# Patient Record
Sex: Male | Born: 1980 | Race: Black or African American | Hispanic: Yes | Marital: Single
Health system: Southern US, Community
[De-identification: ages and names within clinical notes are randomized; demographics above are authoritative.]

## PROBLEM LIST (undated history)

## (undated) DIAGNOSIS — I10 Essential (primary) hypertension: Secondary | ICD-10-CM

## (undated) DIAGNOSIS — K7460 Unspecified cirrhosis of liver: Secondary | ICD-10-CM

## (undated) DIAGNOSIS — G4730 Sleep apnea, unspecified: Secondary | ICD-10-CM

## (undated) HISTORY — DX: Unspecified cirrhosis of liver: K74.60

## (undated) HISTORY — DX: Essential (primary) hypertension: I10

---

## 2012-05-14 ENCOUNTER — Emergency Department (HOSPITAL_COMMUNITY)
Admission: EM | Admit: 2012-05-14 | Discharge: 2012-05-14 | Disposition: A | Discharge status: Other Acute Inpatient Hospital | Payer: Self-pay

## 2013-06-24 ENCOUNTER — Encounter (HOSPITAL_COMMUNITY): Payer: Self-pay | Admitting: Emergency Medicine

## 2013-06-24 ENCOUNTER — Emergency Department (HOSPITAL_COMMUNITY)
Admission: EM | Admit: 2013-06-24 | Discharge: 2013-06-25 | Disposition: A | Discharge status: Home / Self Care | Payer: No Typology Code available for payment source | Attending: Emergency Medicine | Admitting: Emergency Medicine

## 2013-06-24 ENCOUNTER — Emergency Department (HOSPITAL_COMMUNITY): Payer: No Typology Code available for payment source

## 2013-06-24 DIAGNOSIS — R079 Chest pain, unspecified: Secondary | ICD-10-CM | POA: Insufficient documentation

## 2013-06-24 DIAGNOSIS — F17200 Nicotine dependence, unspecified, uncomplicated: Secondary | ICD-10-CM | POA: Insufficient documentation

## 2013-06-24 DIAGNOSIS — M7989 Other specified soft tissue disorders: Secondary | ICD-10-CM | POA: Insufficient documentation

## 2013-06-24 DIAGNOSIS — R0602 Shortness of breath: Secondary | ICD-10-CM | POA: Insufficient documentation

## 2013-06-24 HISTORY — DX: Sleep apnea, unspecified: G47.30

## 2013-06-24 NOTE — ED Notes (Signed)
Pt states he has severe sleep apnea, when he attempts to lie down to sleep he becomes [facility] with central chest discomfort when he takes a deep breath. Pt states his follow up appointment is tomorrow.

## 2013-06-24 NOTE — ED Notes (Signed)
Patient states he only has pain in the chest when he intentionally takes a really deep breath, otherwise there is no pain. He states he is having panic attacks due to his sleep apnea. He has not slept in 3 days due to a fear of not being able to breath. He has an appointment tomorrow for cpap with his primary care physician.

## 2013-06-24 NOTE — ED Provider Notes (Signed)
CSN: [id number]     Arrival date & time 06/24/13  2135 History   First MD Initiated Contact with Patient 06/24/13 2308     Chief Complaint  Patient presents with  . Shortness of Breath  . Chest Pain     (Consider location/radiation/quality/duration/timing/severity/associated sxs/prior Treatment) HPI Comments: Patient is a 33 yo M PMHx significant for tobacco abuse presenting to the ED for increased episodes of shortness of breath and central chest pain over the last several months. He states that he only gets the episodes of SOB and chest pain while he is asleep. Denies any day time symptoms. Patient states that he has had decreased sleep over the last three nights d/t SOB, CP. He states he starts to follow asleep and feels himself stop breathing. He states he has become very anxious about going to sleep. He states he is finally following up with a physician tomorrow to get testing for sleep apnea. Patient endorses intermittent episodes of bilateral leg swelling. Denies any fevers, chills, nausea, vomiting, diarrhea, abdominal pain. PERC negative.    Past Medical History  Diagnosis Date  . Sleep apnea    History reviewed. No pertinent past surgical history. No family history on file. History  Substance Use Topics  . Smoking status: Current Some Day Smoker  . Smokeless tobacco: Not on file  . Alcohol Use: Yes     Comment: social    Review of Systems  Constitutional: Negative for fever and chills.  Respiratory: Positive for shortness of breath. Negative for cough and chest tightness.   Cardiovascular: Positive for chest pain and leg swelling (bilateral).  Gastrointestinal: Negative for nausea, vomiting, abdominal pain, diarrhea and constipation.  All other systems reviewed and are negative.     Allergies  Review of patient's allergies indicates no known allergies.  Home Medications   Prior to Admission medications   Not on File   BP 151/72  Pulse 93  Temp(Src) 98.6 F (37  C) (Oral)  Resp 20  Ht 6' (1.829 m)  Wt 350 lb (158.759 kg)  BMI 47.46 kg/m2  SpO2 99% Physical Exam  Nursing note and vitals reviewed. Constitutional: He is oriented to person, place, and time. He appears well-developed and well-nourished. No distress.  HENT:  Head: Normocephalic and atraumatic.  Right Ear: External ear normal.  Left Ear: External ear normal.  Nose: Nose normal.  Mouth/Throat: Oropharynx is clear and moist. No oropharyngeal exudate.  Eyes: Conjunctivae are normal.  Neck: Neck supple.  Cardiovascular: Normal rate, regular rhythm, normal heart sounds and intact distal pulses.   Cap refill < 3 sec  Pulmonary/Chest: Effort normal and breath sounds normal. No respiratory distress. He has no wheezes. He has no rales. He exhibits no tenderness.  Abdominal: Soft. There is no tenderness.  Musculoskeletal: Normal range of motion. He exhibits no edema.  Neurological: He is alert and oriented to person, place, and time. Gait normal. GCS eye subscore is 4. GCS verbal subscore is 5. GCS motor subscore is 6.  Moves all extremities w/o ataxia.   Skin: Skin is warm and dry. He is not diaphoretic. No cyanosis. Nails show no clubbing.    ED Course  Procedures (including critical care time) Medications - No data to display  Labs Review Labs Reviewed  BASIC METABOLIC PANEL - Abnormal; Notable for the following:    Sodium 136 (*)    All other components within normal limits  TROPONIN I  PRO B NATRIURETIC PEPTIDE  CBC    Imaging  Review Dg Chest 2 View  06/24/2013   CLINICAL DATA:  Short of breath for 3 days.  EXAM: CHEST  2 VIEW  COMPARISON:  09/13/2005  FINDINGS: The heart size and mediastinal contours are within normal limits. Both lungs are clear. The visualized skeletal structures are unremarkable.  IMPRESSION: No active cardiopulmonary disease.   Electronically Signed   By: Amie Portlandavid  Ormond M.D.   On: 06/24/2013 23:38     EKG Interpretation   Date/Time:  Wednesday June 24 2013 21:59:28 EDT Ventricular Rate:  101 PR Interval:  140 QRS Duration: 89 QT Interval:  344 QTC Calculation: 446 R Axis:   56 Text Interpretation:  Sinus tachycardia Nonspecific T abnormalities,  diffuse leads No old tracing to compare Confirmed by CAMPOS  MD, KEVIN  ([id number]) on 06/25/2013 12:42:07 AM      MDM   Final diagnoses:  Shortness of breath    Filed Vitals:   06/25/13 0107  BP: 151/72  Pulse: 93  Temp: 98.6 F (37 C)  Resp: 20   Heart Score 1   Afebrile, NAD, non-toxic appearing, AAOx4.   Patient is to be discharged with recommendation to follow up with PCP at tomorrow's scheduled appointment in regards to today's hospital visit. Chest pain is not likely of cardiac or pulmonary etiology d/t presentation, perc negative, VSS, no tracheal deviation, no JVD or new murmur, RRR, breath sounds equal bilaterally, EKG without acute abnormalities, negative troponin, and negative CXR. Symptoms consistent with sleep apnea. Discussed with patient that he'll likely need CPAP or BiPAP along with weight loss for relief from symptoms and/or correction of these symptoms. Pt has been advised to return to the ED is CP becomes exertional, associated with diaphoresis or nausea, radiates to left jaw/arm, worsens or becomes concerning in any way. Pt appears reliable for follow up and is agreeable to discharge. Case has been discussed with Dr. Patria Maneampos who agrees with the above plan to discharge.      Jeannetta EllisJennifer L Jaquelyn Sakamoto, PA-C 06/25/13 [phone]

## 2013-06-25 LAB — BASIC METABOLIC PANEL
BUN: 10 mg/dL (ref 6–23)
CALCIUM: 9.3 mg/dL (ref 8.4–10.5)
CO2: 23 mEq/L (ref 19–32)
Chloride: 99 mEq/L (ref 96–112)
Creatinine, Ser: 1.01 mg/dL (ref 0.50–1.35)
GLUCOSE: 96 mg/dL (ref 70–99)
Potassium: 3.8 mEq/L (ref 3.7–5.3)
Sodium: 136 mEq/L — ABNORMAL LOW (ref 137–147)

## 2013-06-25 LAB — CBC
HCT: 43.2 % (ref 39.0–52.0)
Hemoglobin: 14.9 g/dL (ref 13.0–17.0)
MCH: 29 pg (ref 26.0–34.0)
MCHC: 34.5 g/dL (ref 30.0–36.0)
MCV: 84 fL (ref 78.0–100.0)
PLATELETS: 269 10*3/uL (ref 150–400)
RBC: 5.14 MIL/uL (ref 4.22–5.81)
RDW: 15.2 % (ref 11.5–15.5)
WBC: 6.4 10*3/uL (ref 4.0–10.5)

## 2013-06-25 LAB — PRO B NATRIURETIC PEPTIDE: PRO B NATRI PEPTIDE: 6.2 pg/mL (ref 0–125)

## 2013-06-25 LAB — TROPONIN I

## 2013-06-25 NOTE — Discharge Instructions (Signed)
Please follow up with your primary care physician in 1-2 days. If you do not have one please call the [facility] and wellness Center number listed above. Please keep your follow up appointment for your sleep apnea tomorrow. Please read all discharge instructions and return precautions.   Sleep Apnea  Sleep apnea is a sleep disorder characterized by abnormal pauses in breathing while you sleep. When your breathing pauses, the level of oxygen in your blood decreases. This causes you to move out of deep sleep and into light sleep. As a result, your quality of sleep is poor, and the system that carries your blood throughout your body (cardiovascular system) experiences stress. If sleep apnea remains untreated, the following conditions can develop:  High blood pressure (hypertension).  Coronary artery disease.  Inability to achieve or maintain an erection (impotence).  Impairment of your thought process (cognitive dysfunction). There are three types of sleep apnea: 1. Obstructive sleep apnea Pauses in breathing during sleep because of a blocked airway. 2. Central sleep apnea Pauses in breathing during sleep because the area of the brain that controls your breathing does not send the correct signals to the muscles that control breathing. 3. Mixed sleep apnea A combination of both obstructive and central sleep apnea. RISK FACTORS The following risk factors can increase your risk of developing sleep apnea:  Being overweight.  Smoking.  Having narrow passages in your nose and throat.  Being of older age.  Being male.  Alcohol use.  Sedative and tranquilizer use.  Ethnicity. Among individuals younger than 35 years, African Americans are at increased risk of sleep apnea. SYMPTOMS   Difficulty staying asleep.  Daytime sleepiness and fatigue.  Loss of energy.  Irritability.  Loud, heavy snoring.  Morning headaches.  Trouble concentrating.  Forgetfulness.  Decreased interest in  sex. DIAGNOSIS  In order to diagnose sleep apnea, your caregiver will perform a physical examination. Your caregiver may suggest that you take a home sleep test. Your caregiver may also recommend that you spend the night in a sleep lab. In the sleep lab, several monitors record information about your heart, lungs, and brain while you sleep. Your leg and arm movements and blood oxygen level are also recorded. TREATMENT The following actions may help to resolve mild sleep apnea:  Sleeping on your side.   Using a decongestant if you have nasal congestion.   Avoiding the use of depressants, including alcohol, sedatives, and narcotics.   Losing weight and modifying your diet if you are overweight. There also are devices and treatments to help open your airway:  Oral appliances. These are custom-made mouthpieces that shift your lower jaw forward and slightly open your bite. This opens your airway.  Devices that create positive airway pressure. This positive pressure "splints" your airway open to help you breathe better during sleep. The following devices create positive airway pressure:  Continuous positive airway pressure (CPAP) device. The CPAP device creates a continuous level of air pressure with an air pump. The air is delivered to your airway through a mask while you sleep. This continuous pressure keeps your airway open.  Nasal expiratory positive airway pressure (EPAP) device. The EPAP device creates positive air pressure as you exhale. The device consists of single-use valves, which are inserted into each nostril and held in place by adhesive. The valves create very little resistance when you inhale but create much more resistance when you exhale. That increased resistance creates the positive airway pressure. This positive pressure while you exhale  keeps your airway open, making it easier to breath when you inhale again.  Bilevel positive airway pressure (BPAP) device. The BPAP device  is used mainly in patients with central sleep apnea. This device is similar to the CPAP device because it also uses an air pump to deliver continuous air pressure through a mask. However, with the BPAP machine, the pressure is set at two different levels. The pressure when you exhale is lower than the pressure when you inhale.  Surgery. Typically, surgery is only done if you cannot comply with less invasive treatments or if the less invasive treatments do not improve your condition. Surgery involves removing excess tissue in your airway to create a wider passage way. Document Released: 12/29/2001 Document Revised: 05/05/2012 Document Reviewed: 05/17/2011 [facility] Patient Information 2014 [place], [place].

## 2013-06-25 NOTE — ED Provider Notes (Signed)
Medical screening examination/treatment/procedure(s) were performed by non-physician practitioner and as supervising physician I was immediately available for consultation/collaboration.   EKG Interpretation   Date/Time:  Wednesday June 24 2013 21:59:28 EDT Ventricular Rate:  101 PR Interval:  140 QRS Duration: 89 QT Interval:  344 QTC Calculation: 446 R Axis:   56 Text Interpretation:  Sinus tachycardia Nonspecific T abnormalities,  diffuse leads No old tracing to compare Confirmed by Chapman Matteucci  MD, Yitzchak Kothari  ([id number]) on 06/25/2013 12:42:07 AM        Lyanne Co, MD 06/25/13 [phone]

## 2013-10-05 ENCOUNTER — Encounter (HOSPITAL_COMMUNITY): Payer: Self-pay | Admitting: Emergency Medicine

## 2013-10-05 ENCOUNTER — Emergency Department (HOSPITAL_COMMUNITY)
Admission: EM | Admit: 2013-10-05 | Discharge: 2013-10-05 | Disposition: A | Discharge status: Home / Self Care | Payer: No Typology Code available for payment source | Attending: Emergency Medicine | Admitting: Emergency Medicine

## 2013-10-05 DIAGNOSIS — G4700 Insomnia, unspecified: Secondary | ICD-10-CM | POA: Diagnosis not present

## 2013-10-05 DIAGNOSIS — F17200 Nicotine dependence, unspecified, uncomplicated: Secondary | ICD-10-CM | POA: Insufficient documentation

## 2013-10-05 DIAGNOSIS — G4730 Sleep apnea, unspecified: Secondary | ICD-10-CM | POA: Insufficient documentation

## 2013-10-05 NOTE — ED Notes (Signed)
Pt c/o insomnia x 5 days and "possibly a seizure" this morning.  Denies pain.  Pt reports "I haven't been able to sleep, because I stop breathing and it triggers a panic attack."  Sts he is supposed to schedule an appointment at sleep clinic, but he "doesn't have the grand to spend."  Pt thinks he had a seizure because he "couldn't move and was shaking."

## 2013-10-05 NOTE — ED Provider Notes (Signed)
CSN: [id number]     Arrival date & time 10/05/13  4098 History   First MD Initiated Contact with Patient 10/05/13 [phone]     Chief Complaint  Patient presents with  . Insomnia     (Consider location/radiation/quality/duration/timing/severity/associated sxs/prior Treatment) HPI 33 year old male presents with worsening insomnia over the past 5 days and was concerned with a possible seizure. Patient has a history of sleep apnea diagnosed in the [place] several years ago. He is currently supposed to be getting a sleep study but is unable to afford the up front cost of $1500. Does not use CPAP. Has a primary care doctor who ordered the sleep test.  Patient feels very tired but when he goes to sleep he feels that he stops breathing and wakes up. This morning he ended up waking up and being unable to move for a few seconds. He also felt twitching in his extremities and was concerned he had a seizure. He is awake during this time and unable to move shortly after. The only time he is able to follow sleep is when he drinks alcohol like he did a few days ago.  Past Medical History  Diagnosis Date  . Sleep apnea    History reviewed. No pertinent past surgical history. History reviewed. No pertinent family history. History  Substance Use Topics  . Smoking status: Current Some Day Smoker  . Smokeless tobacco: Not on file  . Alcohol Use: Yes     Comment: social    Review of Systems  Cardiovascular: Negative for chest pain.  Gastrointestinal: Negative for vomiting.  Neurological: Negative for weakness, numbness and headaches.  All other systems reviewed and are negative.     Allergies  Review of patient's allergies indicates no known allergies.  Home Medications   Prior to Admission medications   Not on File   BP 140/82  Pulse 67  Temp(Src) 98.2 F (36.8 C) (Oral)  Resp 16  SpO2 98% Physical Exam  Nursing note and vitals reviewed. Constitutional: He is oriented to person,  place, and time. He appears well-developed and well-nourished.  obese  HENT:  Head: Normocephalic and atraumatic.  Right Ear: External ear normal.  Left Ear: External ear normal.  Nose: Nose normal.  Eyes: EOM are normal. Pupils are equal, round, and reactive to light. Right eye exhibits no discharge. Left eye exhibits no discharge.  Neck: Neck supple.  Cardiovascular: Normal rate, regular rhythm, normal heart sounds and intact distal pulses.   Pulmonary/Chest: Effort normal and breath sounds normal.  Speaks in normal sentences  Abdominal: Soft. He exhibits no distension. There is no tenderness.  Musculoskeletal: He exhibits no edema.  Neurological: He is alert and oriented to person, place, and time.  Cn 2-12 grossly intact. 5/5 strength in all 4 extremities. Grossly normal sensation  Skin: Skin is warm and dry.    ED Course  Procedures (including critical care time) Labs Review Labs Reviewed - No data to display  Imaging Review No results found.   EKG Interpretation None      MDM   Final diagnoses:  Insomnia  Sleep apnea in adult    Patient's twitching this morning does not seem consistent with a seizure. This seems related to his sleep apnea and overall poor amount of sleep. I offered giving the patient a sleeping aid such as Ambien but the patient declined saying he is tired enough but cannot stay asleep. I told him it is important to lose weight which he says he  has been already working on. The patient does have a primary care doctor will call them right after discharge today to try and see if he can be started on CPAP or needs to finish his sleep study first. At this time however I do not feel he needs any more ER workup and does not need a seizure workup at this time.    Audree Camel, MD 10/05/13 [phone]

## 2013-10-05 NOTE — Discharge Instructions (Signed)
Sleep Apnea  Sleep apnea is a sleep disorder characterized by abnormal pauses in breathing while you sleep. When your breathing pauses, the level of oxygen in your blood decreases. This causes you to move out of deep sleep and into light sleep. As a result, your quality of sleep is poor, and the system that carries your blood throughout your body (cardiovascular system) experiences stress. If sleep apnea remains untreated, the following conditions can develop:  High blood pressure (hypertension).  Coronary artery disease.  Inability to achieve or maintain an erection (impotence).  Impairment of your thought process (cognitive dysfunction). There are three types of sleep apnea: 1. Obstructive sleep apnea--Pauses in breathing during sleep because of a blocked airway. 2. Central sleep apnea--Pauses in breathing during sleep because the area of the brain that controls your breathing does not send the correct signals to the muscles that control breathing. 3. Mixed sleep apnea--A combination of both obstructive and central sleep apnea. RISK FACTORS The following risk factors can increase your risk of developing sleep apnea:  Being overweight.  Smoking.  Having narrow passages in your nose and throat.  Being of older age.  Being male.  Alcohol use.  Sedative and tranquilizer use.  Ethnicity. Among individuals younger than 35 years, African Americans are at increased risk of sleep apnea. SYMPTOMS   Difficulty staying asleep.  Daytime sleepiness and fatigue.  Loss of energy.  Irritability.  Loud, heavy snoring.  Morning headaches.  Trouble concentrating.  Forgetfulness.  Decreased interest in sex. DIAGNOSIS  In order to diagnose sleep apnea, your caregiver will perform a physical examination. Your caregiver may suggest that you take a home sleep test. Your caregiver may also recommend that you spend the night in a sleep lab. In the sleep lab, several monitors record  information about your heart, lungs, and brain while you sleep. Your leg and arm movements and blood oxygen level are also recorded. TREATMENT The following actions may help to resolve mild sleep apnea:  Sleeping on your side.   Using a decongestant if you have nasal congestion.   Avoiding the use of depressants, including alcohol, sedatives, and narcotics.   Losing weight and modifying your diet if you are overweight. There also are devices and treatments to help open your airway:  Oral appliances. These are custom-made mouthpieces that shift your lower jaw forward and slightly open your bite. This opens your airway.  Devices that create positive airway pressure. This positive pressure "splints" your airway open to help you breathe better during sleep. The following devices create positive airway pressure:  Continuous positive airway pressure (CPAP) device. The CPAP device creates a continuous level of air pressure with an air pump. The air is delivered to your airway through a mask while you sleep. This continuous pressure keeps your airway open.  Nasal expiratory positive airway pressure (EPAP) device. The EPAP device creates positive air pressure as you exhale. The device consists of single-use valves, which are inserted into each nostril and held in place by adhesive. The valves create very little resistance when you inhale but create much more resistance when you exhale. That increased resistance creates the positive airway pressure. This positive pressure while you exhale keeps your airway open, making it easier to breath when you inhale again.  Bilevel positive airway pressure (BPAP) device. The BPAP device is used mainly in patients with central sleep apnea. This device is similar to the CPAP device because it also uses an air pump to deliver continuous air pressure  through a mask. However, with the BPAP machine, the pressure is set at two different levels. The pressure when you  exhale is lower than the pressure when you inhale.  Surgery. Typically, surgery is only done if you cannot comply with less invasive treatments or if the less invasive treatments do not improve your condition. Surgery involves removing excess tissue in your airway to create a wider passage way. Document Released: 12/29/2001 Document Revised: 05/05/2012 Document Reviewed: 05/17/2011 [facility] Patient Information 2015 [place], [place]. This information is not intended to replace advice given to you by your health care provider. Make sure you discuss any questions you have with your health care provider.      Insomnia Insomnia is frequent trouble falling and/or staying asleep. Insomnia can be a long term problem or a short term problem. Both are common. Insomnia can be a short term problem when the wakefulness is related to a certain stress or worry. Long term insomnia is often related to ongoing stress during waking hours and/or poor sleeping habits. Overtime, sleep deprivation itself can make the problem worse. Every little thing feels more severe because you are overtired and your ability to cope is decreased. CAUSES   Stress, anxiety, and depression.  Poor sleeping habits.  Distractions such as TV in the bedroom.  Naps close to bedtime.  Engaging in emotionally charged conversations before bed.  Technical reading before sleep.  Alcohol and other sedatives. They may make the problem worse. They can hurt normal sleep patterns and normal dream activity.  Stimulants such as caffeine for several hours prior to bedtime.  Pain syndromes and shortness of breath can cause insomnia.  Exercise late at night.  Changing time zones may cause sleeping problems (jet lag). It is sometimes helpful to have someone observe your sleeping patterns. They should look for periods of not breathing during the night (sleep apnea). They should also look to see how long those periods last. If you live alone or  observers are uncertain, you can also be observed at a sleep clinic where your sleep patterns will be professionally monitored. Sleep apnea requires a checkup and treatment. Give your caregivers your medical history. Give your caregivers observations your family has made about your sleep.  SYMPTOMS   Not feeling rested in the morning.  Anxiety and restlessness at bedtime.  Difficulty falling and staying asleep. TREATMENT   Your caregiver may prescribe treatment for an underlying medical disorders. Your caregiver can give advice or help if you are using alcohol or other drugs for self-medication. Treatment of underlying problems will usually eliminate insomnia problems.  Medications can be prescribed for short time use. They are generally not recommended for lengthy use.  Over-the-counter sleep medicines are not recommended for lengthy use. They can be habit forming.  You can promote easier sleeping by making lifestyle changes such as:  Using relaxation techniques that help with breathing and reduce muscle tension.  Exercising earlier in the day.  Changing your diet and the time of your last meal. No night time snacks.  Establish a regular time to go to bed.  Counseling can help with stressful problems and worry.  Soothing music and white noise may be helpful if there are background noises you cannot remove.  Stop tedious detailed work at least one hour before bedtime. HOME CARE INSTRUCTIONS   Keep a diary. Inform your caregiver about your progress. This includes any medication side effects. See your caregiver regularly. Take note of:  Times when you are asleep.  Times when you are awake during the night.  The quality of your sleep.  How you feel the next day. This information will help your caregiver care for you.  Get out of bed if you are still awake after 15 minutes. Read or do some quiet activity. Keep the lights down. Wait until you feel sleepy and go back to  bed.  Keep regular sleeping and waking hours. Avoid naps.  Exercise regularly.  Avoid distractions at bedtime. Distractions include watching television or engaging in any intense or detailed activity like attempting to balance the household checkbook.  Develop a bedtime ritual. Keep a familiar routine of bathing, brushing your teeth, climbing into bed at the same time each night, listening to soothing music. Routines increase the success of falling to sleep faster.  Use relaxation techniques. This can be using breathing and muscle tension release routines. It can also include visualizing peaceful scenes. You can also help control troubling or intruding thoughts by keeping your mind occupied with boring or repetitive thoughts like the old concept of counting sheep. You can make it more creative like imagining planting one beautiful flower after another in your backyard garden.  During your day, work to eliminate stress. When this is not possible use some of the previous suggestions to help reduce the anxiety that accompanies stressful situations. MAKE SURE YOU:   Understand these instructions.  Will watch your condition.  Will get help right away if you are not doing well or get worse. Document Released: 01/06/2000 Document Revised: 04/02/2011 Document Reviewed: 02/05/2007 [facility] Patient Information 2015 [place], [place]. This information is not intended to replace advice given to you by your health care provider. Make sure you discuss any questions you have with your health care provider.

## 2014-11-13 IMAGING — CR DG CHEST 2V
2 series · 2 of 2 positions shown · non-contrast
Comparison: 09/13/2005

CLINICAL DATA: Short of breath for 3 days.

EXAM:
CHEST  2 VIEW

[w chest pa]
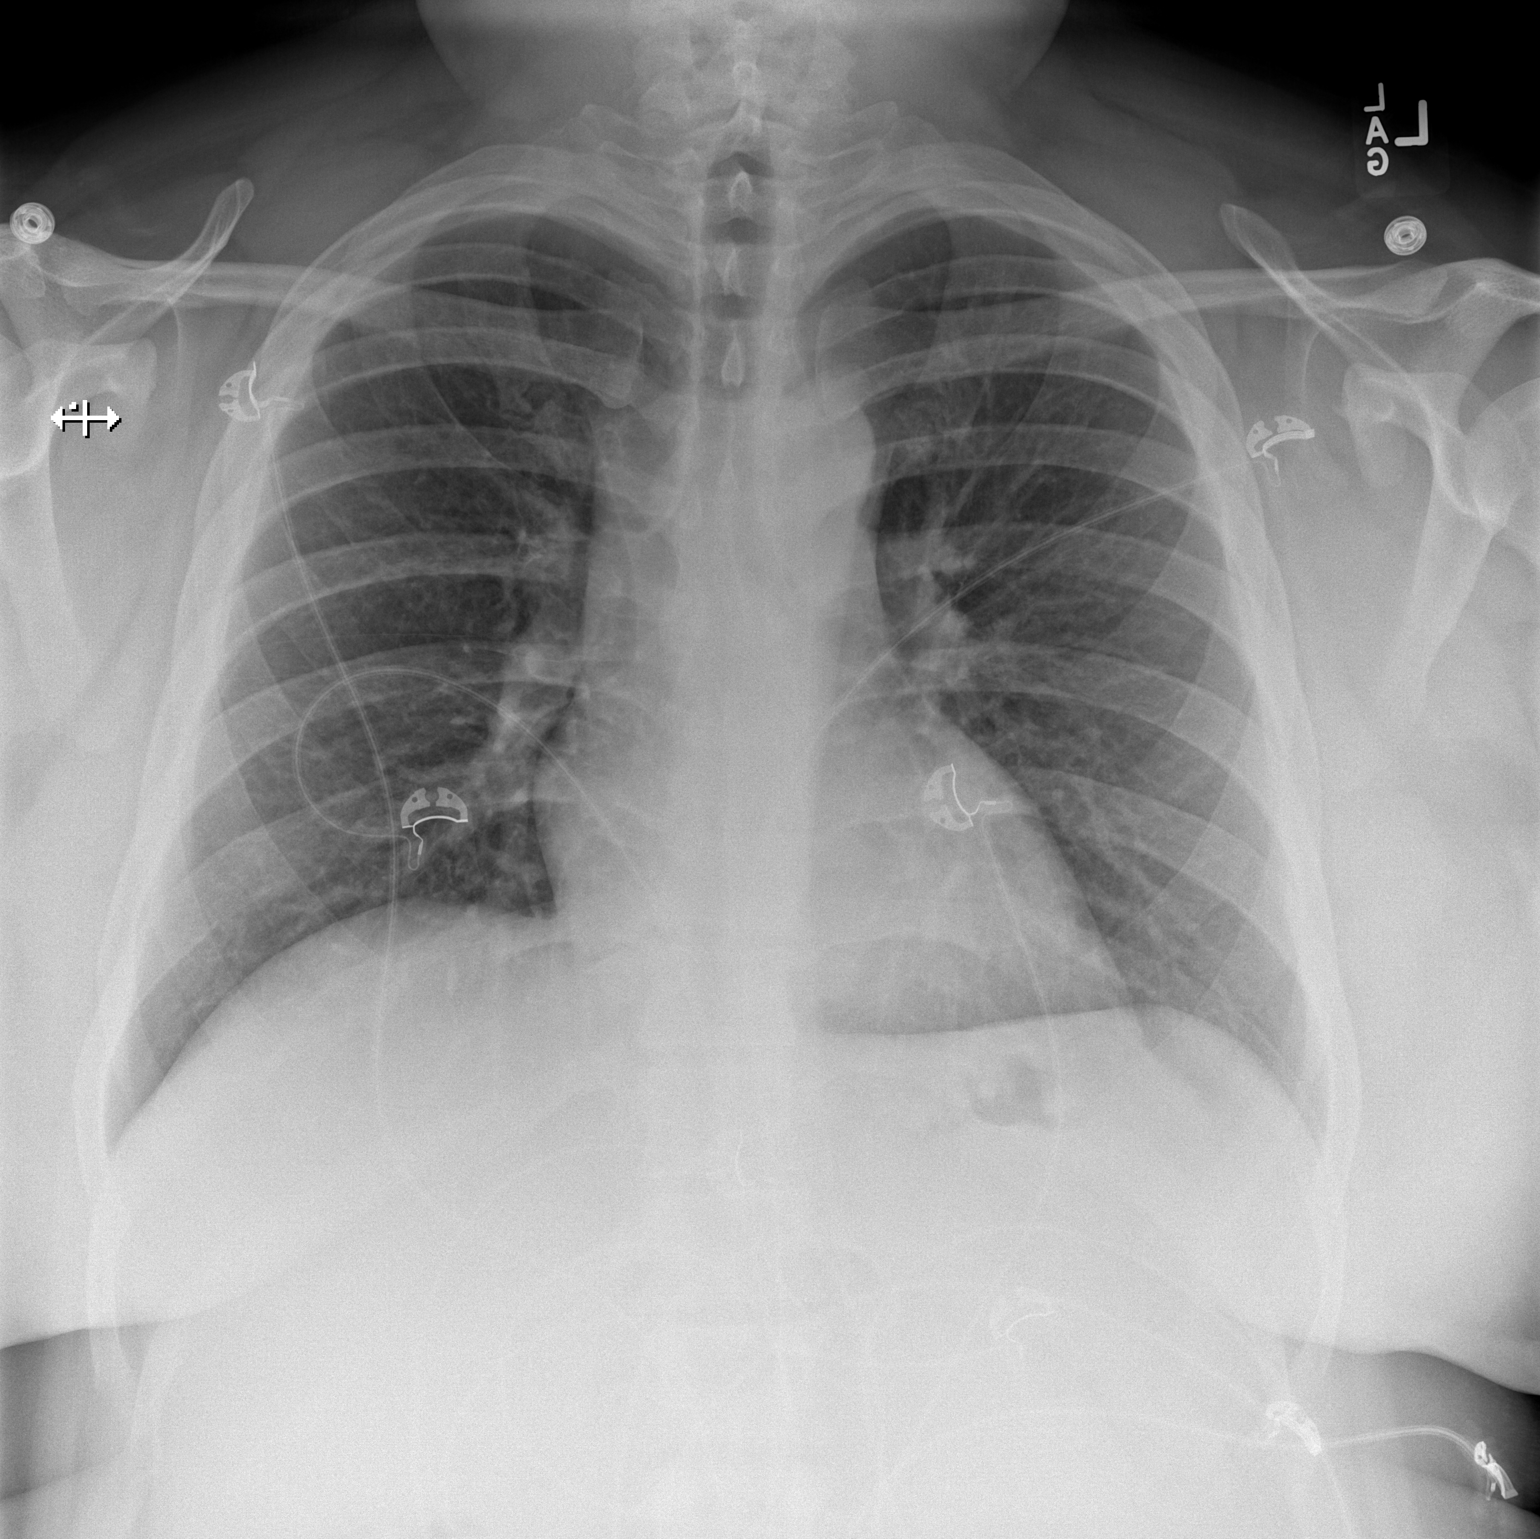

[w chest lat]
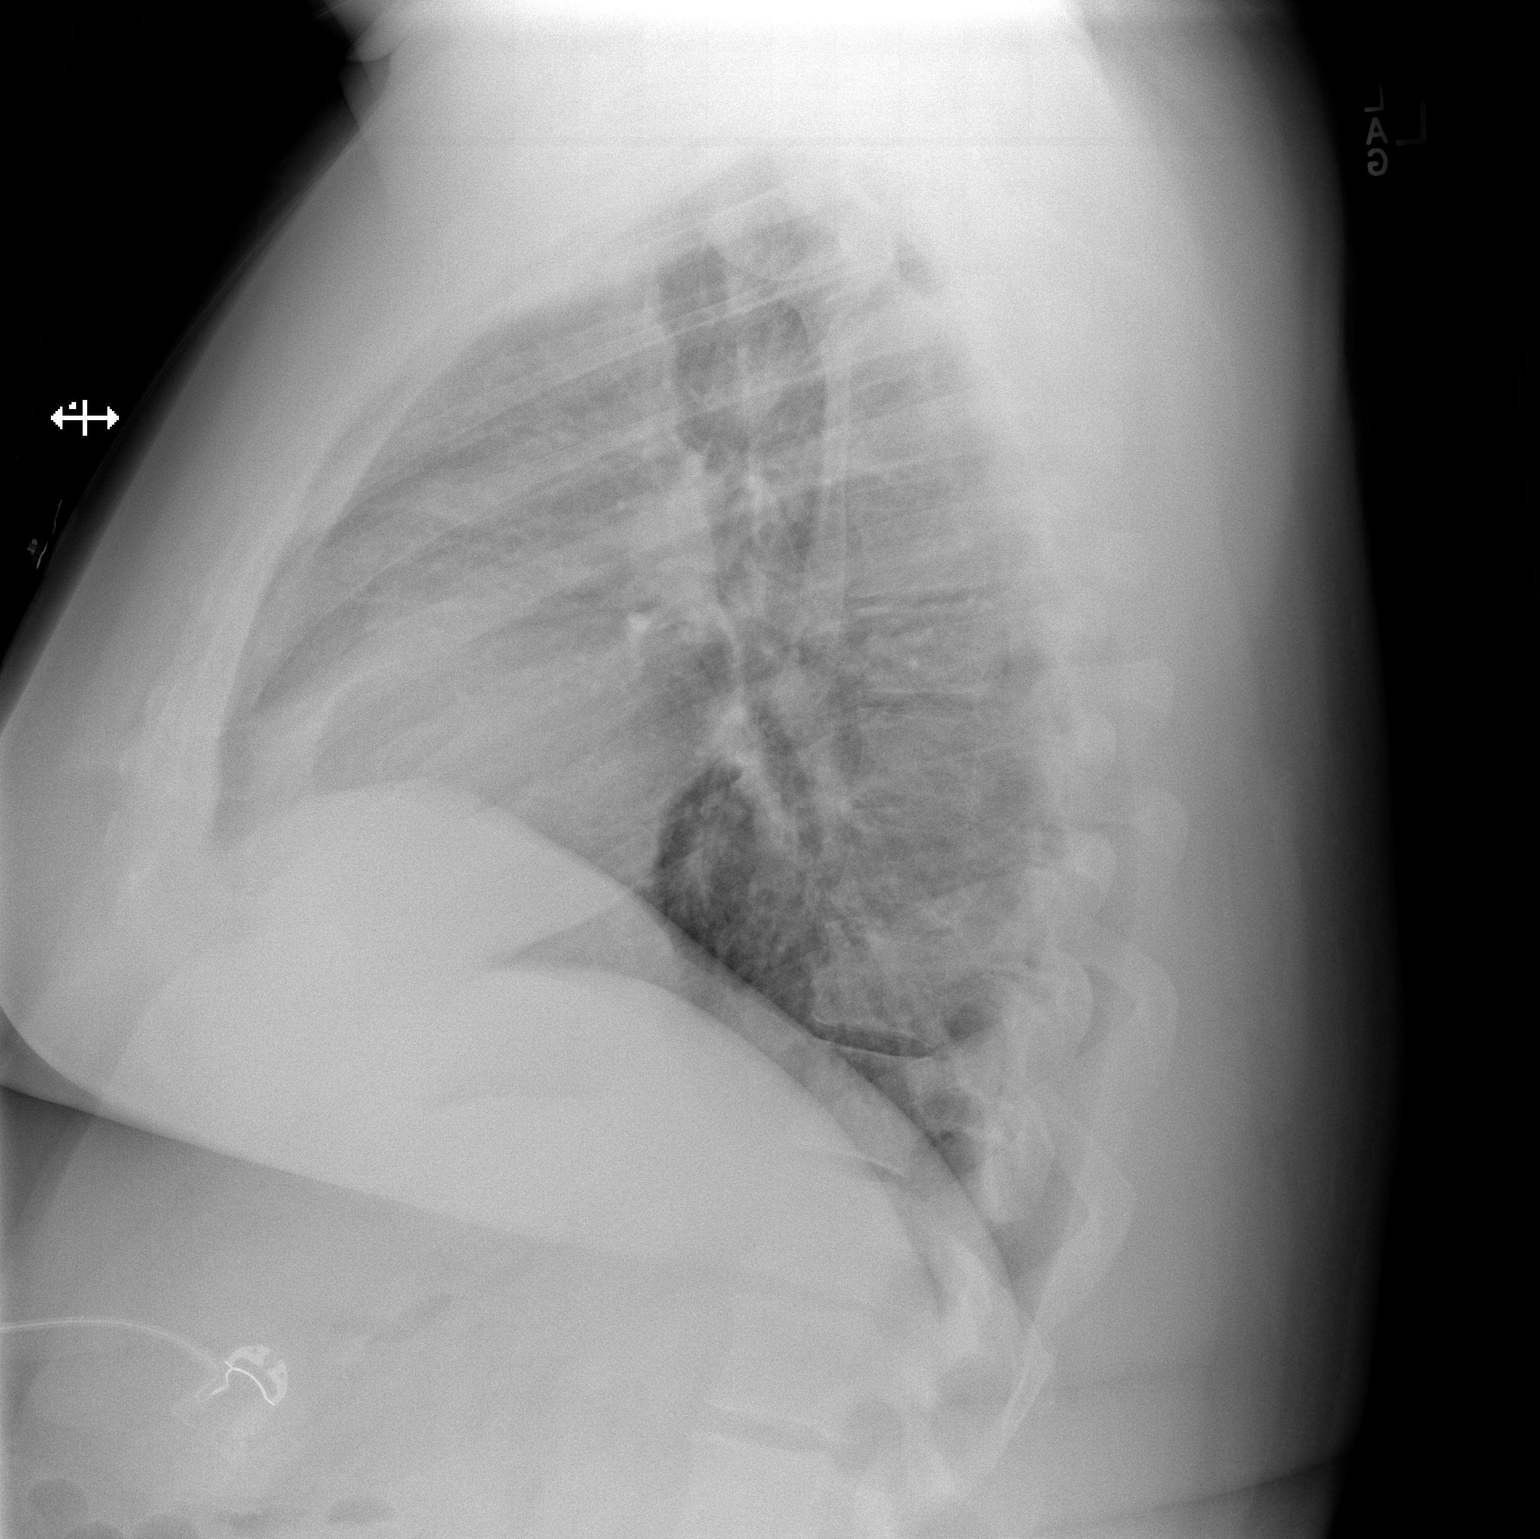

[2 of 2 positions shown; findings below may reference images not displayed]

FINDINGS: The heart size and mediastinal contours are within normal limits.
Both lungs are clear. The visualized skeletal structures are
unremarkable.
IMPRESSION: No active cardiopulmonary disease.

## 2020-09-24 ENCOUNTER — Ambulatory Visit (HOSPITAL_COMMUNITY): Admission: EM | Admit: 2020-09-24 | Discharge: 2020-09-24 | Discharge status: Against Medical Advice | Payer: 59

## 2020-09-24 ENCOUNTER — Other Ambulatory Visit: Payer: Self-pay

## 2020-09-24 NOTE — ED Triage Notes (Signed)
Pt called from front lobby ,no answer 

## 2020-09-24 NOTE — ED Notes (Signed)
No persons present in waiting area.

## 2020-11-22 ENCOUNTER — Inpatient Hospital Stay: Payer: 59 | Attending: Hematology & Oncology | Admitting: Hematology & Oncology

## 2020-11-22 ENCOUNTER — Encounter: Payer: Self-pay | Admitting: Hematology & Oncology

## 2020-11-22 ENCOUNTER — Other Ambulatory Visit: Payer: Self-pay

## 2020-11-22 ENCOUNTER — Other Ambulatory Visit: Payer: Self-pay | Admitting: *Deleted

## 2020-11-22 ENCOUNTER — Inpatient Hospital Stay: Payer: 59

## 2020-11-22 VITALS — BP 140/99 | HR 87 | Temp 98.7°F | Resp 18 | Ht 72.0 in | Wt 267.5 lb

## 2020-11-22 DIAGNOSIS — G4730 Sleep apnea, unspecified: Secondary | ICD-10-CM | POA: Diagnosis not present

## 2020-11-22 DIAGNOSIS — F1090 Alcohol use, unspecified, uncomplicated: Secondary | ICD-10-CM

## 2020-11-22 DIAGNOSIS — D700 Congenital agranulocytosis: Secondary | ICD-10-CM

## 2020-11-22 DIAGNOSIS — D72819 Decreased white blood cell count, unspecified: Secondary | ICD-10-CM

## 2020-11-22 DIAGNOSIS — Z87891 Personal history of nicotine dependence: Secondary | ICD-10-CM | POA: Diagnosis not present

## 2020-11-22 LAB — CBC WITH DIFFERENTIAL (CANCER CENTER ONLY)
Abs Immature Granulocytes: 0.01 10*3/uL (ref 0.00–0.07)
Basophils Absolute: 0 10*3/uL (ref 0.0–0.1)
Basophils Relative: 1 %
Eosinophils Absolute: 0.1 10*3/uL (ref 0.0–0.5)
Eosinophils Relative: 3 %
HCT: 41.8 % (ref 39.0–52.0)
Hemoglobin: 14.6 g/dL (ref 13.0–17.0)
Immature Granulocytes: 0 %
Lymphocytes Relative: 46 %
Lymphs Abs: 1.1 10*3/uL (ref 0.7–4.0)
MCH: 32.3 pg (ref 26.0–34.0)
MCHC: 34.9 g/dL (ref 30.0–36.0)
MCV: 92.5 fL (ref 80.0–100.0)
Monocytes Absolute: 0.3 10*3/uL (ref 0.1–1.0)
Monocytes Relative: 12 %
Neutro Abs: 0.9 10*3/uL — ABNORMAL LOW (ref 1.7–7.7)
Neutrophils Relative %: 38 %
Platelet Count: 168 10*3/uL (ref 150–400)
RBC: 4.52 MIL/uL (ref 4.22–5.81)
RDW: 13.8 % (ref 11.5–15.5)
WBC Count: 2.4 10*3/uL — ABNORMAL LOW (ref 4.0–10.5)
nRBC: 0 % (ref 0.0–0.2)

## 2020-11-22 LAB — COMPREHENSIVE METABOLIC PANEL
ALT: 61 U/L — ABNORMAL HIGH (ref 0–44)
AST: 62 U/L — ABNORMAL HIGH (ref 15–41)
Albumin: 4.4 g/dL (ref 3.5–5.0)
Alkaline Phosphatase: 52 U/L (ref 38–126)
Anion gap: 11 (ref 5–15)
BUN: 6 mg/dL (ref 6–20)
CO2: 28 mmol/L (ref 22–32)
Calcium: 9.7 mg/dL (ref 8.9–10.3)
Chloride: 100 mmol/L (ref 98–111)
Creatinine, Ser: 0.76 mg/dL (ref 0.61–1.24)
GFR, Estimated: >60 mL/min (ref >60)
Glucose, Bld: 91 mg/dL (ref 70–99)
Potassium: 3.5 mmol/L (ref 3.5–5.1)
Sodium: 139 mmol/L (ref 135–145)
Total Bilirubin: 0.8 mg/dL (ref 0.3–1.2)
Total Protein: 7.5 g/dL (ref 6.5–8.1)

## 2020-11-22 LAB — SAVE SMEAR(SSMR), FOR PROVIDER SLIDE REVIEW

## 2020-11-22 LAB — LACTATE DEHYDROGENASE: LDH: 180 U/L (ref 98–192)

## 2020-11-22 NOTE — Progress Notes (Signed)
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  HPI: Jonathan Castaneda is a really nice 40 year old [place] male.  He is from the [place].  It was fun talk to him about baseball and how bad the drivers are in the [place].  He was found to have low white cells recently.  Of note, back in 2015, his white cell count was normal at 6.4.  He says that he has been drinking alcohol quite a bit lately.  He says this helps him sleep.  He drinks whiskey.  I am sure that he drinks quite a bit of this.  He does not pass out.  He used to smoke cigarettes.  He now vapes.  His white cell count was recently seen by his family doctor at I think 2.3.  Because this, his family doctor referred him to the [facility] for an evaluation.  When he had his blood count done.  His white count was 2.1.  Hemoglobin 14.9.  Platelet count 244,000.  He had a normal white cell distribution.  Again, he says he been drinking quite a bit.  We may have to get a ultrasound of his abdomen to see if he has cirrhosis and if he has splenomegaly.  He does not have sickle cell.  There is no blood problems in the family.  He is not a vegetarian.  He has 1 daughter.  She is 72 years old.  He has had no swollen lymph nodes.  He has had no problem with infections.  He has had COVID which was asymptomatic back last year.  He actually was down in the [place] and he tested positive.  Currently, I would say his performance status is probably ECOG 0.    Past Medical History:  Diagnosis Date   Sleep apnea   :  History reviewed. No pertinent surgical history.:  No current outpatient medications on file.:  :  No Known Allergies:  History reviewed. No pertinent family history.:   Social History   Socioeconomic History   Marital status: Single     Spouse name: Not on file   Number of children: Not on file   Years of education: Not on file   Highest education level: Not on file  Occupational History   Not on file  Tobacco Use   Smoking status: Former    Packs/day: 1.00    Years: 15.00    Pack years: 15.00    Types: Cigarettes    Quit date: 01/23/2015    Years since quitting: 5.8   Smokeless tobacco: Never  Vaping Use   Vaping Use: Every day   Start date: 01/23/2015   Devices: Reillable pod changed approximately weekly  Substance and Sexual Activity   Alcohol use: Yes    Comment: social   Drug use: No   Sexual activity: Not on file  Other Topics Concern   Not on file  Social History Narrative   Not on file   Social Determinants of Health   Financial Resource Strain: Not on file  Food Insecurity: Not on file  Transportation Needs: Not on file  Physical Activity: Not on file  Stress: Not on file  Social Connections: Not on file  Intimate Partner Violence: Not on file  :  Review of Systems  Constitutional: Negative.   HENT: Negative.    Eyes:  Negative.   Respiratory: Negative.    Cardiovascular: Negative.   Gastrointestinal: Negative.   Genitourinary: Negative.   Musculoskeletal: Negative.   Skin: Negative.   Neurological: Negative.   Endo/Heme/Allergies: Negative.   Psychiatric/Behavioral: Negative.      Exam: @IPVITALS @ This is a somewhat obese African-American male in no obvious distress.  Vital signs show temperature of 98.7.  Pulse 87.  Blood pressure 140/99.  Weight is 267 pounds.  Head and neck exam shows no ocular or oral lesions.  He has no adenopathy in the neck.  There is no palpable thyroid.  Lungs are clear bilaterally.  Cardiac exam regular rate and rhythm with no murmurs, rubs or bruits.  Abdomen is obese but soft.  He has decent bowel sounds.  There is no fluid wave.  There is no obvious palpable liver or spleen tip.  Back exam shows no tenderness over the spine, ribs or hips.  Extremities shows  no clubbing, cyanosis or edema.  He has no arthritic changes in his joints.  There is no joint swelling, erythema or tenderness.  Neurological exam is nonfocal.  Skin exam shows skin lesions on his back.  These are hyperpigmented.  However, there is one that is darker than the others.  This is in the center of his back.  It is flat.  Is slightly irregular in shape.   Recent Labs    11/22/20 1344  WBC 2.4*  HGB 14.6  HCT 41.8  PLT 168    Recent Labs    11/22/20 1404  NA 139  K 3.5  CL 100  CO2 28  GLUCOSE 91  BUN 6  CREATININE 0.76  CALCIUM 9.7    Blood smear review: Normochromic and normocytic population of red blood cells.  He has no nucleated red blood cells.  There are no teardrop cells.  He has no rouleaux formation.  There are no target cells.  He has decreased white blood cells.  I do appear mature.  I see no hypersegmented polys.  He has no immature myeloid or lymphoid cells.  There are no blasts.  Platelets are adequate in number and size.  Pathology: None    Assessment and Plan: Jonathan Castaneda is a very nice 40 year old 25 male.  He has leukopenia.  I had to believe this leukopenia is from his alcohol use.  It sounds like he does have significant alcohol use.  I must say that he is incredibly open about his use.  He wants to try to stop using alcohol.  Hopefully, he will be able to do this.  1 thing that I worry about is his lesion on his back.  This 1 lesion is darker than the others.  It may be nothing but I still think it needs to be looked at.  We will have to see if his family doctor can make the referral for a dermatologist.  I think this lesion probably needs to be looked out and may be even removed.  I will get an ultrasound of his abdomen to look for his liver and spleen.  We will see if he has splenomegaly.  We will see if is any cirrhosis.  I have to believe that the low white cells are from alcohol use.  There may be some alcohol toxicity with his bone  marrow.  He is totally asymptomatic.  Forgot to mention that he also has sleep apnea.  Hopefully this will also be treated.  I think from my point of view,  we can probably get him back in 6 months just to see how everything looks.  Hopefully, by then, he will be off alcohol.  We will see if this helps his blood counts.  Again he is very very nice.  He is fun to talk to.

## 2020-11-24 LAB — HGB FRACTIONATION CASCADE
Hgb A2: 2.7 % (ref 1.8–3.2)
Hgb A: 97.3 % (ref 96.4–98.8)
Hgb F: 0 % (ref 0.0–2.0)
Hgb S: 0 %

## 2020-11-28 ENCOUNTER — Telehealth: Payer: Self-pay | Admitting: *Deleted

## 2020-11-28 NOTE — Telephone Encounter (Signed)
Spoke with Beryle Lathe NP nurse to let her know Dr Myna Hidalgo is trying to reach her to discuss a lesion that he observed on Patients back on last visit.  Dr Myna Hidalgo would like for area to be looked at and referred to dermatology. [place] is not in the office today but Enrique Sack sending her a message along with the office note about the lesion and this will be taken care of.

## 2020-12-05 ENCOUNTER — Ambulatory Visit (HOSPITAL_BASED_OUTPATIENT_CLINIC_OR_DEPARTMENT_OTHER): Admission: RE | Admit: 2020-12-05 | Payer: 59 | Source: Ambulatory Visit

## 2020-12-05 ENCOUNTER — Other Ambulatory Visit (HOSPITAL_BASED_OUTPATIENT_CLINIC_OR_DEPARTMENT_OTHER): Payer: No Typology Code available for payment source

## 2021-05-22 ENCOUNTER — Inpatient Hospital Stay: Payer: No Typology Code available for payment source | Attending: Hematology & Oncology

## 2021-05-22 ENCOUNTER — Inpatient Hospital Stay: Payer: No Typology Code available for payment source | Admitting: Hematology & Oncology

## 2022-04-10 ENCOUNTER — Encounter: Payer: Self-pay | Admitting: *Deleted

## 2022-10-20 ENCOUNTER — Other Ambulatory Visit: Payer: Self-pay

## 2022-10-20 ENCOUNTER — Emergency Department (HOSPITAL_COMMUNITY)
Admission: EM | Admit: 2022-10-20 | Discharge: 2022-10-23 | Disposition: E | Payer: BLUE CROSS/BLUE SHIELD | Attending: Emergency Medicine | Admitting: Emergency Medicine

## 2022-10-20 ENCOUNTER — Encounter (HOSPITAL_COMMUNITY): Payer: Self-pay | Admitting: *Deleted

## 2022-10-20 DIAGNOSIS — K7460 Unspecified cirrhosis of liver: Secondary | ICD-10-CM

## 2022-10-20 DIAGNOSIS — I469 Cardiac arrest, cause unspecified: Secondary | ICD-10-CM

## 2022-10-20 HISTORY — DX: Unspecified cirrhosis of liver: K74.60

## 2022-10-20 HISTORY — DX: Essential (primary) hypertension: I10

## 2022-10-20 MED ORDER — SODIUM BICARBONATE 8.4 % IV SOLN
INTRAVENOUS | Status: AC | PRN
  Administered 2022-10-20 (×2): 50 meq via INTRAVENOUS

## 2022-10-20 MED ORDER — EPINEPHRINE 1 MG/10ML IJ SOSY
PREFILLED_SYRINGE | INTRAMUSCULAR | Status: AC | PRN
  Administered 2022-10-20 (×5): 1 mg via INTRAVENOUS

## 2022-10-20 MED ORDER — CALCIUM CHLORIDE 10 % IV SOLN
INTRAVENOUS | Status: AC | PRN
Start: 2022-10-20 — End: 2022-10-20
  Administered 2022-10-20: 1 g via INTRAVENOUS
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
## 2022-10-20 NOTE — ED Notes (Signed)
[facility] contacted [facility] eye and tissue donation  number [phone] cocdinator timothy shore

## 2022-10-20 NOTE — ED Notes (Addendum)
   Pt pronounced at 1941 by dr Eloise Harman
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
## 2022-10-20 NOTE — Progress Notes (Signed)
2022/10/24 2053  Spiritual Encounters  Type of Visit Follow up  Care provided to: [facility] partners present during encounter Nurse;Physician  Reason for visit Patient death  OnCall Visit Yes   Chaplain returned to ED as family of the Pt arrived. Chaplain accompanied the doctor to inform family and then remained behind to offer comfort and support. On coming chaplain informed of situation and will follow up with family.  Chaplain services remain available by Spiritual Consult or for emergent cases, paging [phone]  Chaplain Raelene Bott, MDiv [email or web address] [email or web address] [phone]

## 2022-10-20 NOTE — ED Notes (Signed)
Parents given the phone number for bed control when a funeral home is decided on

## 2022-10-22 ENCOUNTER — Encounter: Payer: Self-pay | Admitting: Hematology & Oncology

## 2022-10-23 NOTE — Progress Notes (Signed)
I responded to a page from the nurse to provide spiritual support for the patient's family. I arrived at the patient's room where several family members were present. I shared words of comfort, provided pastoral presence, and led in prayer.    09/24/2022 0009  Spiritual Encounters  Type of Visit Initial  Care provided to: Family  Conversation partners present during encounter Nurse  Referral source Nurse (RN/NT/LPN)  Reason for visit Patient death  OnCall Visit Yes  Interventions  Spiritual Care Interventions Made Established relationship of care and support;Compassionate presence;Bereavement/grief support;Prayer;Supported grief process    Chaplain Dr Melvyn Novas

## 2022-10-23 DEATH — deceased
